# Patient Record
Sex: Female | Born: 1990 | Race: White | Hispanic: No | Marital: Married | Smoking: Never smoker
Health system: Southern US, Community
[De-identification: ages and names within clinical notes are randomized; demographics above are authoritative.]

## PROBLEM LIST (undated history)

## (undated) HISTORY — PX: SEPTOPLASTY: SUR1290

---

## 2018-11-21 ENCOUNTER — Other Ambulatory Visit: Payer: Self-pay

## 2018-11-21 ENCOUNTER — Emergency Department: Payer: 59

## 2018-11-21 ENCOUNTER — Emergency Department
Admission: EM | Admit: 2018-11-21 | Discharge: 2018-11-21 | Disposition: A | Discharge status: Home / Self Care | Payer: 59 | Attending: Emergency Medicine | Admitting: Emergency Medicine

## 2018-11-21 DIAGNOSIS — M7989 Other specified soft tissue disorders: Secondary | ICD-10-CM | POA: Insufficient documentation

## 2018-11-21 DIAGNOSIS — O1203 Gestational edema, third trimester: Secondary | ICD-10-CM

## 2018-11-21 DIAGNOSIS — O26893 Other specified pregnancy related conditions, third trimester: Secondary | ICD-10-CM | POA: Insufficient documentation

## 2018-11-21 DIAGNOSIS — Z3A34 34 weeks gestation of pregnancy: Secondary | ICD-10-CM | POA: Diagnosis not present

## 2018-11-21 NOTE — Discharge Instructions (Signed)
Please follow-up with your OB/GYN as soon as you return home for labs and a urine to rule out preeclampsia.  In the meantime if you start having headache, floaters in your vision, abdominal pain, or worsening swelling in your legs please return to the emergency room for further evaluation.

## 2018-11-21 NOTE — ED Notes (Signed)
Verbal order for [place] from Dr. Lenard Lance

## 2018-11-21 NOTE — ED Triage Notes (Signed)
Pt c/o of R foot swelling and R ankle pain. Ankle is slightly swollen. No redness noted. Pt worried about blood clot. Just flew from [place] a few days ago.   34 weeks. No pregnancy complaints.

## 2018-11-21 NOTE — ED Notes (Signed)
Pt ambulatory to stat desk at this time stating that "I really don't feel like waiting any longer. Can't you guys just give me my results". Pt informed by this RN that I cannot give out results, a doctor has to give results. Pt states "can;t they just call me or can't he just tell me back there". This RN reiterated to pt that she is waiting on a bed and pt's cannot walk back to the back for a doctor to give results. Pt then remarked again that she does not want to exposed anymore to people in the lobby, This RN informed pt again that she will have to wait and be exposed to people in the lobby if she wants her results. Pt went back to chair with NAD.

## 2018-11-21 NOTE — ED Notes (Signed)
Patient refused offer of saltines.

## 2018-11-21 NOTE — ED Provider Notes (Signed)
[facility] Emergency Department Provider Note  ____________________________________________  Time seen: Approximately 10:35 PM  I have reviewed the triage vital signs and the nursing notes.   HISTORY  Chief Complaint Leg Swelling   HPI Lucie LeatherKristine Reigle is a 28 y.o. female with no significant past medical history who presents for evaluation of leg swelling.  Patient is currently [redacted] weeks pregnant.  She is from [place] and flew in a few days ago.  She is returning home tomorrow.  She has an appointment with her OB/GYN in 2 days.  She is noted over the last few days progressively worsening swelling of the right foot and ankle.  She reports that she had a charley horse episode in the beginning of the week and since then has had mild throbbing pain and swelling of the right leg.  She has also noted milder swelling of the L ankle as well but no pain. This is her second pregnancy.  She denies preeclampsia or any other complications during this pregnancy or prior pregnancy.  She denies headache or floaters, no abdominal pain.  She reports recently having labs done last week by her OB/GYN which were all normal.  She denies personal family history of blood clots, she denies chest pain or shortness of breath.  PMH Migraines  Allergies Patient has no known allergies.  FH Diabetes Father    Hypertension Father    Diabetes Maternal Grandfather    Heart Maternal Grandfather      Social History Social History   Tobacco Use  . Smoking status: Never Smoker  Substance Use Topics  . Alcohol use: Not Currently  . Drug use: Not on file    Review of Systems  Constitutional: Negative for fever. Eyes: Negative for visual changes. ENT: Negative for sore throat. Neck: No neck pain  Cardiovascular: Negative for chest pain. Respiratory: Negative for shortness of breath. Gastrointestinal: Negative for abdominal pain, vomiting or diarrhea. Genitourinary: Negative  for dysuria. Musculoskeletal: Negative for back pain. + bilateral ankle and feet swelling R>L Skin: Negative for rash. Neurological: Negative for headaches, weakness or numbness. Psych: No SI or HI  ____________________________________________   PHYSICAL EXAM:  VITAL SIGNS: ED Triage Vitals [11/21/18 1833]  Enc Vitals Group     BP 135/88     Pulse Rate 80     Resp 18     Temp 97.7 F (36.5 C)     Temp Source Oral     SpO2 99 %     Weight 192 lb (87.1 kg)     Height 5\' 7"  (1.702 m)     Head Circumference      Peak Flow      Pain Score 0     Pain Loc      Pain Edu?      Excl. in GC?     Constitutional: Alert and oriented. Well appearing and in no apparent distress. HEENT:      Head: Normocephalic and atraumatic.         Eyes: Conjunctivae are normal. Sclera is non-icteric.       Mouth/Throat: Mucous membranes are moist.       Neck: Supple with no signs of meningismus. Cardiovascular: Regular rate and rhythm. No murmurs, gallops, or rubs. 2+ symmetrical distal pulses are present in all extremities. No JVD. Respiratory: Normal respiratory effort. Lungs are clear to auscultation bilaterally. No wheezes, crackles, or rhonchi.  Gastrointestinal: Soft, non tender, and non distended with positive bowel sounds. No rebound  or guarding. Musculoskeletal: 1+ pitting edema of the right ankle and dorsum of the foot, trace edema of the L ankle and foot, no erythema, no warmth, no tenderness, strong distal pulses Neurologic: Normal speech and language. Face is symmetric. Moving all extremities. No gross focal neurologic deficits are appreciated. Skin: Skin is warm, dry and intact. No rash noted. Psychiatric: Mood and affect are normal. Speech and behavior are normal.  ____________________________________________   LABS (all labs ordered are listed, but only abnormal results are displayed)  Labs Reviewed - No data to display ____________________________________________  EKG  none    ____________________________________________  RADIOLOGY  I have personally reviewed the images performed during this visit and I agree with the Radiologist's read.   Interpretation by Radiologist:  US Venous Img Lower Unilateral Right  Result Date: 11/21/2018 CLINICAL DATA:  Ankle swelling following long trip, initial encounter EXAM: RIGHT LOWER EXTREMITY VENOUS DOPPLER ULTRASOUND TECHNIQUE: Gray-scale sonography with graded compression, as well as color Doppler and duplex ultrasound were performed to evaluate the lower extremity deep venous systems from the level of the common femoral vein and including the common femoral, femoral, profunda femoral, popliteal and calf veins including the posterior tibial, peroneal and gastrocnemius veins when visible. The superficial great saphenous vein was also interrogated. Spectral Doppler was utilized to evaluate flow at rest and with distal augmentation maneuvers in the common femoral, femoral and popliteal veins. COMPARISON:  None. FINDINGS: Contralateral Common Femoral Vein: Respiratory phasicity is normal and symmetric with the symptomatic side. No evidence of thrombus. Normal compressibility. Common Femoral Vein: No evidence of thrombus. Normal compressibility, respiratory phasicity and response to augmentation. Saphenofemoral Junction: No evidence of thrombus. Normal compressibility and flow on color Doppler imaging. Profunda Femoral Vein: No evidence of thrombus. Normal compressibility and flow on color Doppler imaging. Femoral Vein: No evidence of thrombus. Normal compressibility, respiratory phasicity and response to augmentation. Popliteal Vein: No evidence of thrombus. Normal compressibility, respiratory phasicity and response to augmentation. Calf Veins: No evidence of thrombus. Normal compressibility and flow on color Doppler imaging. Superficial Great Saphenous Vein: No evidence of thrombus. Normal compressibility. Venous Reflux:  None. Other  Findings:  None. IMPRESSION: No evidence of deep venous thrombosis. Electronically Signed   By: Alcide Clever M.D.   On: 11/21/2018 19:54     ____________________________________________   PROCEDURES  Procedure(s) performed: None Procedures Critical Care performed:  None ____________________________________________   INITIAL IMPRESSION / ASSESSMENT AND PLAN / ED COURSE   28 y.o. female with no significant past medical history, currently [redacted] weeks GA who presents for evaluation of bilateral ankle swelling R>L.  Patient has 1+ pitting edema of the right ankle and right foot.  Minimal trace edema of the left ankle and left foot.  No signs of cellulitis or ischemia.  Doppler ultrasound negative for DVT.  Initially in triage patient's BP was elevated 135/88 but patient reports that she was really upset.  Upon arrival to the room her blood pressure is normal.  She has had no prior history of preeclampsia.  I recommended to check labs to rule out help syndrome or proteinuria as possible causes of her swelling.  Patient reports that she is flying home tomorrow and prefers to see her OB on Monday for these labs.  I explained to her that if she has preeclampsia that can lead to eclampsia, seizures, and lead to severe morbidity and mortality of her and the baby.  Patient understands these risks but continues to request to have these test done  by her OB/GYN.  I told her that she was welcome to come back to the emergency room at any time if she change her mind.  I also discussed with her return precautions for headaches, floaters, nausea vomiting, abdominal pain, contractions, vaginal bleeding.  Patient be discharged home at this time.      As part of my medical decision making, I reviewed the following data within the electronic MEDICAL RECORD NUMBER Nursing notes reviewed and incorporated, Old chart reviewed, Radiograph reviewed , Notes from prior ED visits and [place] Controlled Substance Database    Pertinent  labs & imaging results that were available during my care of the patient were reviewed by me and considered in my medical decision making (see chart for details).    ____________________________________________   FINAL CLINICAL IMPRESSION(S) / ED DIAGNOSES  Final diagnoses:  Leg swelling in pregnancy in third trimester      NEW MEDICATIONS STARTED DURING THIS VISIT:  ED Discharge Orders    None       Note:  This document was prepared using Dragon voice recognition software and may include unintentional dictation errors.    Nita Sickle, MD 11/21/18 [phone]

## 2019-06-21 IMAGING — US US EXTREM LOW VENOUS*R*
1 series · 13 of 24 positions shown · non-contrast
Comparison: None.

CLINICAL DATA: Ankle swelling following long trip, initial
encounter



[Series 1: us extrem low venous*right* · 13 of 34 slices shown]
[im 1/34]
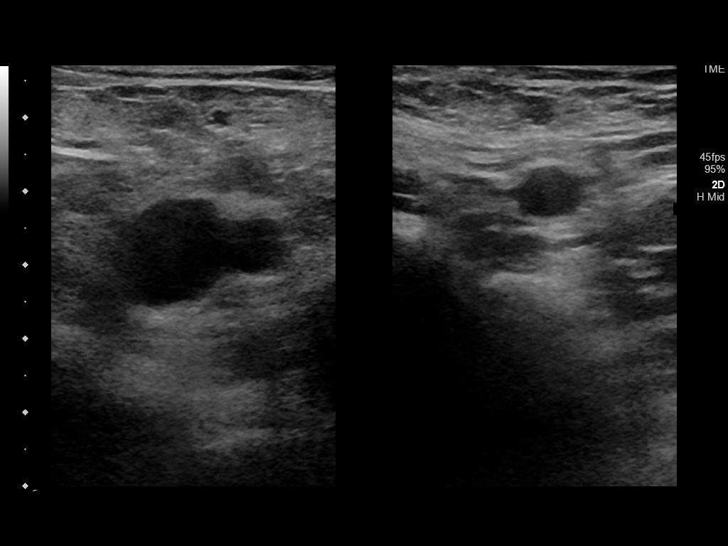
[im 3/34]
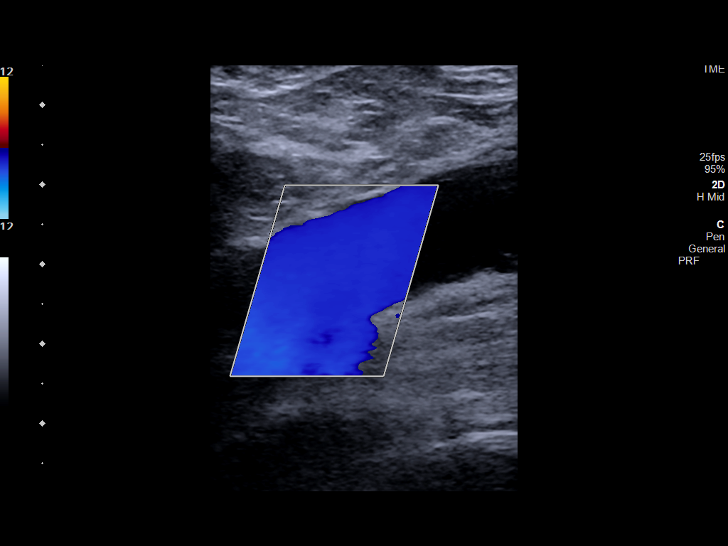
[im 6/34]
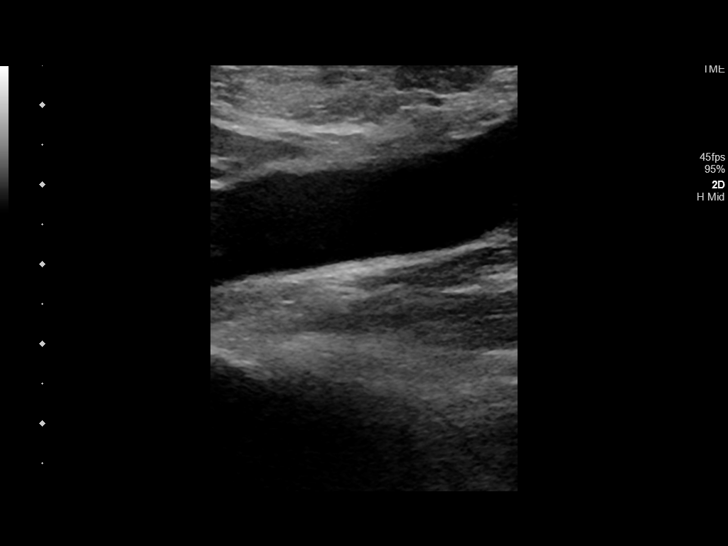
[im 9/34]
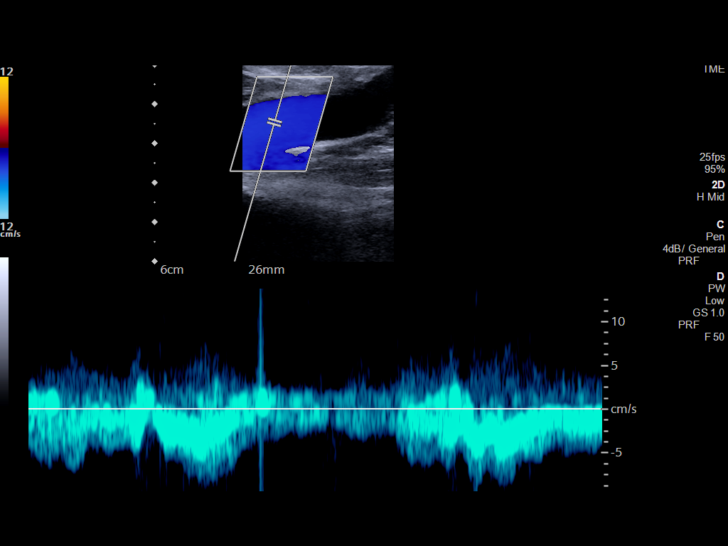
[im 12/34]
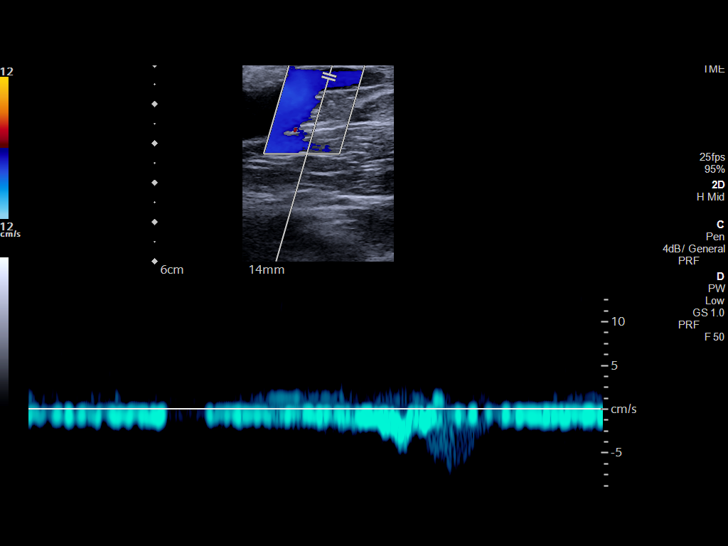
[im 15/34]
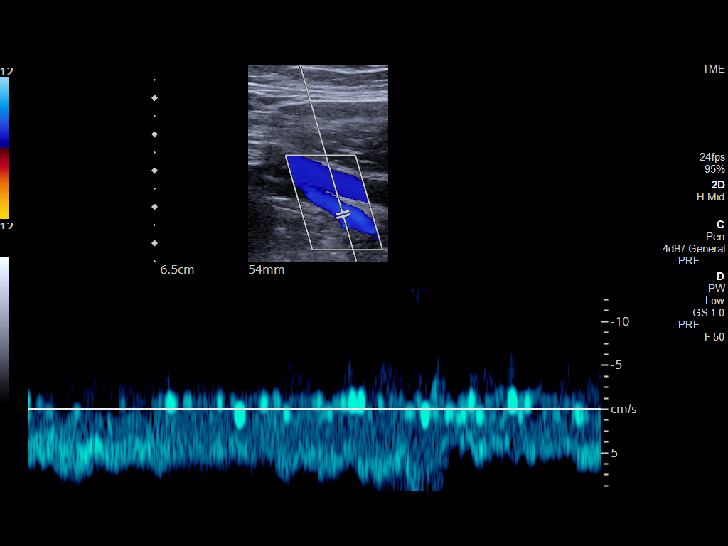
[im 18/34]
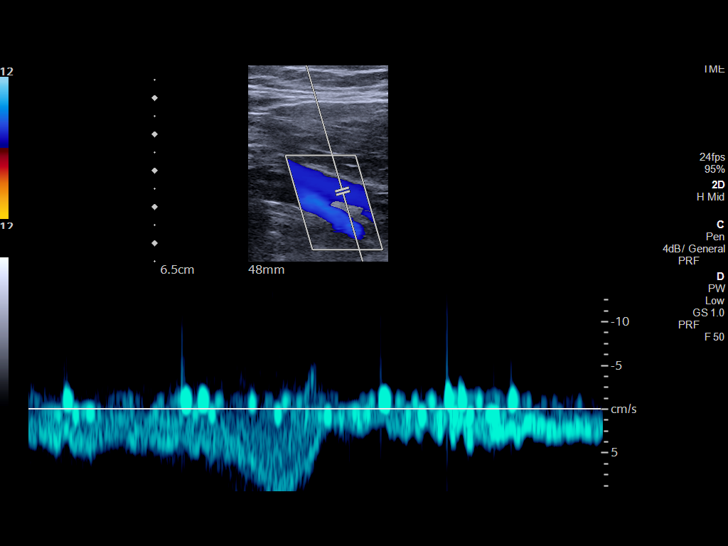
[im 19/34]
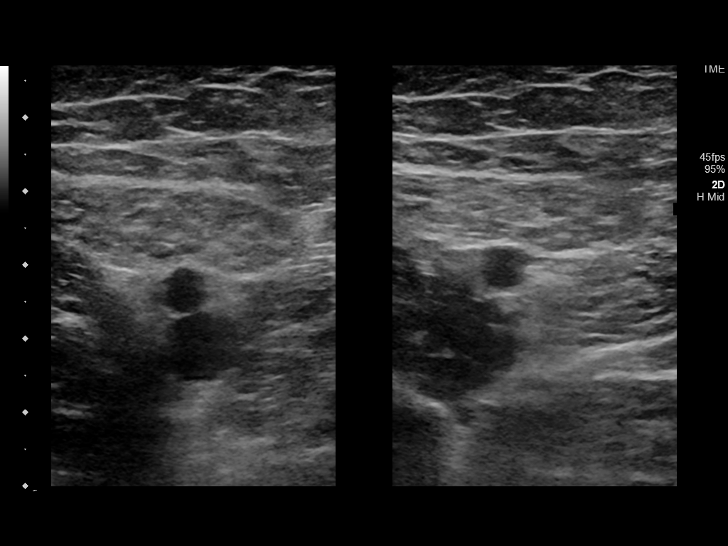
[im 22/34]
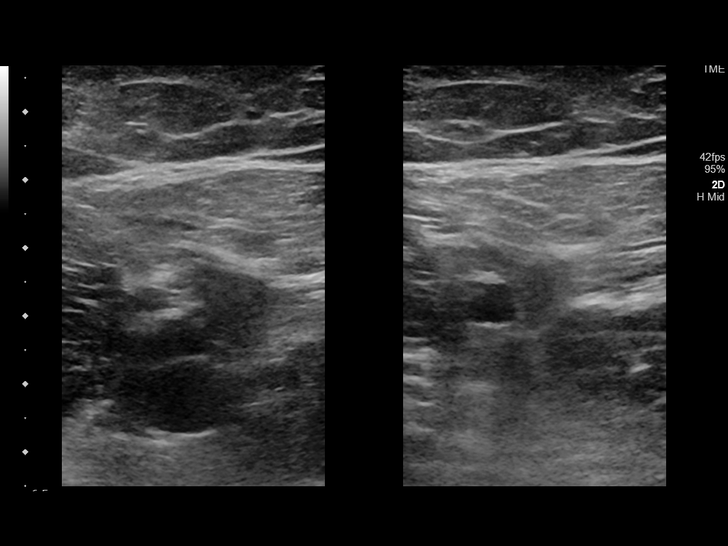
[im 25/34]
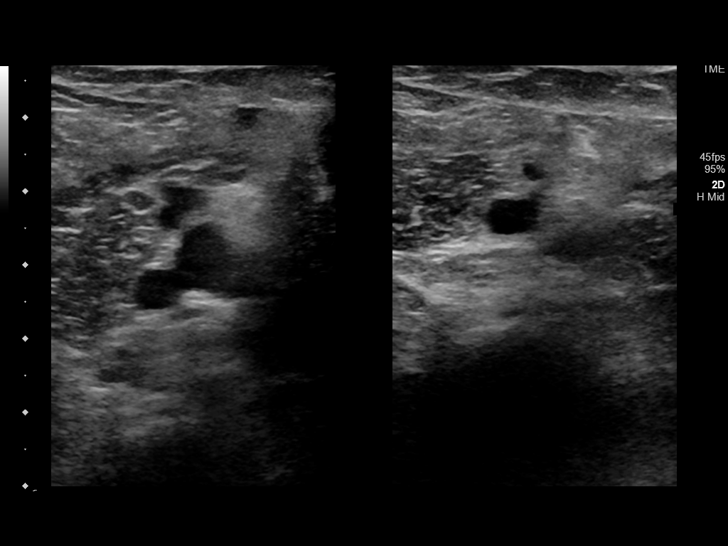
[im 28/34]
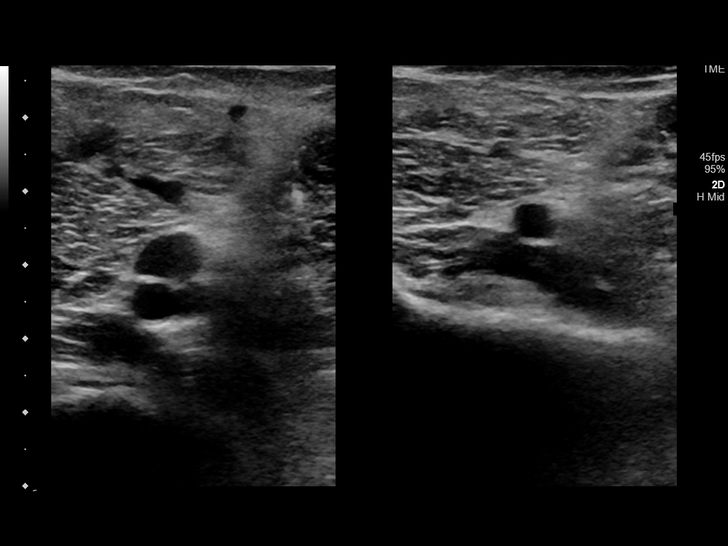
[im 31/34]
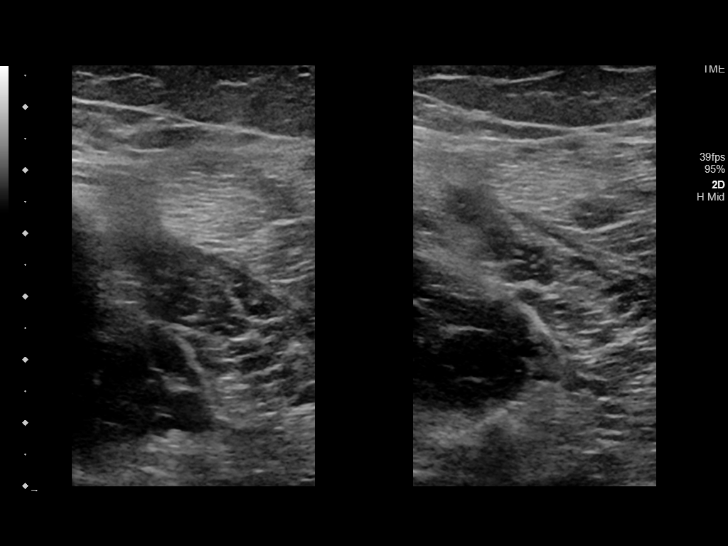
[im 34/34]
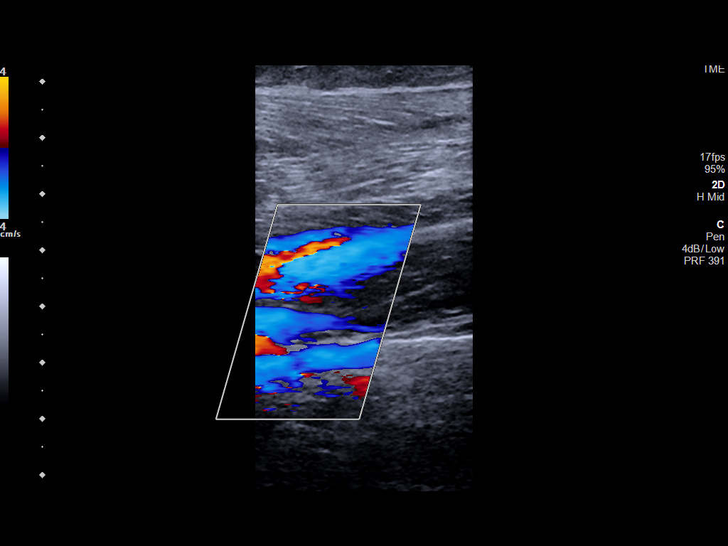

[13 of 24 positions shown; findings below may reference images not displayed]

FINDINGS: Contralateral Common Femoral Vein: Respiratory phasicity is normal
and symmetric with the symptomatic side. No evidence of thrombus.
Normal compressibility.

Common Femoral Vein: No evidence of thrombus. Normal
compressibility, respiratory phasicity and response to augmentation.

Saphenofemoral Junction: No evidence of thrombus. Normal
compressibility and flow on color Doppler imaging.

Profunda Femoral Vein: No evidence of thrombus. Normal
compressibility and flow on color Doppler imaging.

Femoral Vein: No evidence of thrombus. Normal compressibility,
respiratory phasicity and response to augmentation.

Popliteal Vein: No evidence of thrombus. Normal compressibility,
respiratory phasicity and response to augmentation.

Calf Veins: No evidence of thrombus. Normal compressibility and flow
on color Doppler imaging.

Superficial Great Saphenous Vein: No evidence of thrombus. Normal
compressibility.

Venous Reflux:  None.

Other Findings:  None.
IMPRESSION: No evidence of deep venous thrombosis.
